# Patient Record
Sex: Male | Born: 1985 | Race: Black or African American | Hispanic: No | Smoking: Never smoker
Health system: Southern US, Community
[De-identification: ages and names within clinical notes are randomized; demographics above are authoritative.]

## PROBLEM LIST (undated history)

## (undated) HISTORY — PX: ROTATOR CUFF REPAIR: SHX139

---

## 2020-06-24 ENCOUNTER — Inpatient Hospital Stay
Admit: 2020-06-24 | Discharge: 2020-06-25 | Disposition: A | Discharge status: Home / Self Care | Attending: Emergency Medicine

## 2020-06-24 DIAGNOSIS — A64 Unspecified sexually transmitted disease: Secondary | ICD-10-CM

## 2020-06-24 NOTE — ED Provider Notes (Signed)
ED Provider Notes by Tressie Ellis, MD at 06/24/20 2009                Author: Tressie Ellis, MD  Service: --  Author Type: Physician       Filed: 06/24/20 2312  Date of Service: 06/24/20 2009  Status: Signed          Editor: Tressie Ellis, MD (Physician)               EMERGENCY DEPARTMENT HISTORY AND PHYSICAL EXAM           Date: 06/24/2020   Patient Name: Matthew Wilkinson        History of Presenting Illness          Chief Complaint       Patient presents with        ?  Penile Discharge           HPI: Matthew Wilkinson, 34 y.o.  male with no past medical history presenting with mild dysuria and penile discharge for the past week.  Patient denies any fevers or chills.   No low pain, nausea, or vomiting.  No diarrhea.  Has been practicing safe sex with one sexual partner however reports that he wants to be tested and treated prophylactically for STDs.         PCP: None        Current Outpatient Medications          Medication  Sig  Dispense  Refill           ?  trimethoprim-polymyxin b (POLYTRIM) ophthalmic solution  Administer 1 Drop to both eyes every four (4) hours.  10 mL  0             Medical History     I reviewed the medical, surgical, family, and social history, as well as allergies:      Past Medical History:   No past medical history on file.      Past Surgical History:   No past surgical history on file.      Family History:   No family history on file.      Social History:     Social History          Tobacco Use         ?  Smoking status:  Not on file     ?  Smokeless tobacco:  Not on file       Substance Use Topics         ?  Alcohol use:  Not on file         ?  Drug use:  Not on file           Allergies:   No Known Allergies        Review of Systems        Review of Systems    All other systems reviewed and are negative.              Physical Exam and Vital Signs     Vital Signs - Reviewed the patient's vital signs.      Patient Vitals for the past 12 hrs:            Temp  Pulse  Resp  BP  SpO2             06/24/20 1721  98.2 ??F (36.8 ??C)  100  16  132/70  100 %  Physical Exam:      GENERAL: awake, alert, cooperative, not in distress   HEENT:   * Pupils equal, EOMI   * Head atraumatic   CV:   * regular rhythm   * warm and perfused extremities bilaterally   PULMONARY: Good air movement   ABDOMEN: soft, not distended, no guarding, not tenderness to palpation   GU: No suprapubic tenderness.  No expressible penile discharge.  No tenderness over the testicles.  No lesions.   EXTREMITIES/BACK: warm and perfused, no tenderness, no edema   SKIN: no rashes or signs of trauma   NEURO:   * Speech clear   * Moves U&LE to command           Medical Decision Making and ED Course     - I am the first and primary provider for this patient and am the primary provider of record.   - I reviewed the vital signs, available nursing notes, past medical history, past surgical history, family history and social history.   - Initial assessment performed. The patients presenting problems have been discussed, and the staff are in agreement with the care plan formulated and outlined with them.  I have encouraged them to ask questions as they arise throughout their visit.   - Available medical records, nursing notes, old EKGs, and EMS run sheets (if patient was EMS transported) were reviewed      MDM:    Patient is a 34 y.o. male  presenting for penile discharge. Vitals reveal no abnormalities and physical exam reveals normalities. Based on the history, physical exam, risk factors, and vitals signs, differential includes: Gonorrhea, chlamydia, trichomonas, UTI.           Results        Labs:     Recent Results (from the past 12 hour(s))     URINALYSIS W/MICROSCOPIC          Collection Time: 06/24/20  5:31 PM         Result  Value  Ref Range            Color  Yellow/Straw          Appearance  Clear  Clear         Specific gravity  >1.030 (H)  1.003 - 1.030       pH (UA)  5.0  5.0 - 8.0         Protein  Negative  Negative mg/dL        Glucose  Negative  Negative mg/dL       Ketone  5 (A)  Negative mg/dL       Bilirubin  Negative  Negative         Blood  Negative  Negative         Urobilinogen  2.0 (H)  0.1 - 1.0 EU/dL       Nitrites  Negative  Negative         Leukocyte Esterase  Trace (A)  Negative         WBC  5-10  0 - 4 /hpf       RBC  0-5  0 - 5 /hpf       Bacteria  Negative  Negative /hpf            Mucus  2+  /lpf           Radiologic Studies:     CT Results   (Last 48 hours)  None                 CXR Results   (Last 48 hours)          None                  Medications ordered:     Medications       cefTRIAXone (ROCEPHIN) 500 mg in lidocaine (XYLOCAINE) 10 mg/mL (1 %) IM syringe (500 mg IntraMUSCular Given 06/24/20 2005)     metroNIDAZOLE (FLAGYL) tablet 2,000 mg (2,000 mg Oral Given 06/24/20 2003)       azithromycin (ZITHROMAX) tablet 1,000 mg (1,000 mg Oral Given 06/24/20 2004)              ED Course        ED Course:         ED Course as of 06/24/20 2312       Mon Jun 24, 2020        1915  UA shows mild sterile pyuria. Concern for STD. Less likely UTI. Will defer antibiotics for UTI till cultures.  [SS]     1953  Patient opts for prophylactic STD treatment. Will give treatments and follow up with return precautions. Patient also has viral conjunctivitis. Will  give polytrim to use in 2 days if sxs dor resolve or get worse. [SS]              ED Course User Index   [SS] Tressie Ellis, MD             Final Disposition        Disposition: Condition stable   DC- Adult Discharges: All of the diagnostic tests were reviewed and questions answered. Diagnosis, care plan and treatment options were discussed.  The patient understands the instructions and will follow up as directed. The patients results have been  reviewed with them.  They have been counseled regarding their diagnosis.  The patient verbally convey understanding and agreement of the signs, symptoms, diagnosis, treatment and prognosis and additionally agrees to follow up as  recommended with their  PCP in 24 - 48 hours.  They also agree with the care-plan and convey that all of their questions have been answered.  I have also put together some discharge instructions for them that include: 1) educational information regarding their diagnosis, 2)  how to care for their diagnosis at home, as well a 3) list of reasons why they would want to return to the ED prior to their follow-up appointment, should their condition change.      DISCHARGE PLAN:   1. Cannot display discharge medications since this patient is not currently admitted.      2.      Follow-up Information               Follow up With  Specialties  Details  Why  Contact Info              [facility]  Emergency Medicine  Go to   If symptoms worsen  [address] [facility] [place] [address]   [phone]              Your doctor    Schedule an appointment as soon as possible for a visit in 2 days                 3.  Return to ED if worse    4.  Discharge Medication List as of 06/24/2020  7:58 PM              START taking these medications          Details        trimethoprim-polymyxin b (POLYTRIM) ophthalmic solution  Administer 1 Drop to both eyes every four (4) hours., Normal, Disp-10 mL, R-0                                 Diagnosis        Clinical Impression:       1.  STD (male)            Attestations:      Tressie Ellis, MD      Please note that this dictation was completed with Dragon, the computer voice recognition software.  Quite often unanticipated grammatical, syntax, homophones, and other interpretive errors are inadvertently  transcribed by the computer software.  Please disregard these errors.  Please excuse any errors that have escaped final proofreading.  Thank you.

## 2020-06-24 NOTE — ED Notes (Signed)
Std eval

## 2020-06-25 LAB — URINALYSIS W/MICROSCOPIC
Bacteria: NEGATIVE /hpf
Bilirubin: NEGATIVE
Blood: NEGATIVE
Glucose: NEGATIVE mg/dL
Ketone: 5 mg/dL — AB
Nitrites: NEGATIVE
Protein: NEGATIVE mg/dL
Specific gravity: >1.03 — ABNORMAL HIGH (ref 1.003–1.030)
Urobilinogen: 2 EU/dL — ABNORMAL HIGH (ref 0.1–1.0)
pH (UA): 5 (ref 5.0–8.0)

## 2020-06-25 LAB — URINALYSIS WITH MICROSCOPIC
BACTERIA, URINE: NEGATIVE /hpf
Bilirubin, Urine: NEGATIVE
Blood, Urine: NEGATIVE
Glucose, Ur: NEGATIVE mg/dL
Ketones, Urine: 5 mg/dL — AB
Nitrite, Urine: NEGATIVE
Protein, UA: NEGATIVE mg/dL
Specific Gravity, UA: >1.03 NA — ABNORMAL HIGH (ref 1.003–1.030)
Urobilinogen, UA, POCT: 2 EU/dL — ABNORMAL HIGH (ref 0.1–1.0)
pH, UA: 5 (ref 5.0–8.0)

## 2020-06-25 MED ORDER — LIDOCAINE HCL 1 % (10 MG/ML) IJ SOLN
10 mg/mL (1 %) | Freq: Once | INTRAMUSCULAR | Status: AC
Start: 2020-06-25 — End: 2020-06-24
  Administered 2020-06-25: 01:00:00 via INTRAMUSCULAR

## 2020-06-25 MED ORDER — AZITHROMYCIN 500 MG TAB
500 mg | ORAL | Status: AC
Start: 2020-06-25 — End: 2020-06-24
  Administered 2020-06-25: 01:00:00 via ORAL

## 2020-06-25 MED ORDER — TRIMETHOPRIM-POLYMYXIN B 0.1 %-10,000 UNIT/ML EYE DROPS
10000 unit- 1 mg/mL | OPHTHALMIC | 0 refills | Status: AC
Start: 2020-06-25 — End: ?

## 2020-06-25 MED ORDER — METRONIDAZOLE 500 MG TAB
500 mg | ORAL | Status: AC
Start: 2020-06-25 — End: 2020-06-24
  Administered 2020-06-25: 01:00:00 via ORAL

## 2020-06-25 MED FILL — CEFTRIAXONE 500 MG SOLUTION FOR INJECTION: 500 mg | INTRAMUSCULAR | Qty: 500

## 2020-06-25 MED FILL — AZITHROMYCIN 500 MG TAB: 500 mg | ORAL | Qty: 2

## 2020-06-25 MED FILL — METRONIDAZOLE 500 MG TAB: 500 mg | ORAL | Qty: 4

## 2020-06-28 LAB — CHLAMYDIA / GC-AMPLIFIED
CHLAMYDIA TRACHOMATIS, NAA, 188078: NEGATIVE
Chlamydia trachomatis, NAA: NEGATIVE
NEISSERIA GONORRHOEAE, NAA, 188086: NEGATIVE
Neisseria gonorrhoeae, NAA: NEGATIVE

## 2021-07-13 ENCOUNTER — Encounter (HOSPITAL_BASED_OUTPATIENT_CLINIC_OR_DEPARTMENT_OTHER): Payer: Self-pay

## 2021-07-13 ENCOUNTER — Emergency Department (HOSPITAL_BASED_OUTPATIENT_CLINIC_OR_DEPARTMENT_OTHER)
Admission: EM | Admit: 2021-07-13 | Discharge: 2021-07-13 | Disposition: A | Discharge status: Home / Self Care | Payer: Self-pay | Attending: Emergency Medicine | Admitting: Emergency Medicine

## 2021-07-13 ENCOUNTER — Other Ambulatory Visit: Payer: Self-pay

## 2021-07-13 ENCOUNTER — Emergency Department (HOSPITAL_BASED_OUTPATIENT_CLINIC_OR_DEPARTMENT_OTHER): Payer: Self-pay | Admitting: Radiology

## 2021-07-13 DIAGNOSIS — S42142A Displaced fracture of glenoid cavity of scapula, left shoulder, initial encounter for closed fracture: Secondary | ICD-10-CM

## 2021-07-13 DIAGNOSIS — S42145A Nondisplaced fracture of glenoid cavity of scapula, left shoulder, initial encounter for closed fracture: Secondary | ICD-10-CM | POA: Insufficient documentation

## 2021-07-13 DIAGNOSIS — X58XXXA Exposure to other specified factors, initial encounter: Secondary | ICD-10-CM | POA: Insufficient documentation

## 2021-07-13 NOTE — ED Provider Notes (Signed)
[facility] EMERGENCY DEPT Provider Note   CSN: [id number] Arrival date & time: 07/13/21  1417     History  Chief Complaint  Patient presents with   Shoulder Injury    Thomas House is a 36 y.o. male.  Patient presents to the emergency department for evaluation of left shoulder pain.  He was holding his girlfriend yesterday and pretended to drop her.  To prevent her from falling, he caught her with his arms and had onset of left shoulder pain.  He did not know if he might of dislocated his shoulder when this occurred.  He has had pain with movement of the left arm, especially raising it above shoulder level.  No distal numbness or tingling.  OTC meds prior to arrival.  Onset of symptoms acute.  Course is constant.  Pain is worse with movement.      Home Medications Prior to Admission medications   Not on File      Allergies    Patient has no known allergies.    Review of Systems   Review of Systems  Constitutional:  Negative for activity change.  Musculoskeletal:  Positive for arthralgias. Negative for back pain, gait problem, joint swelling and neck pain.  Skin:  Negative for wound.  Neurological:  Negative for weakness and numbness.   Physical Exam Updated Vital Signs BP 128/78    Pulse 84    Temp 98.3 F (36.8 C)    Resp 16    Ht 5\' 8"  (1.727 m)    Wt 102.1 kg    SpO2 96%    BMI 34.21 kg/m  Physical Exam Vitals and nursing note reviewed.  Constitutional:      Appearance: He is well-developed.  HENT:     Head: Normocephalic and atraumatic.  Eyes:     Conjunctiva/sclera: Conjunctivae normal.  Cardiovascular:     Pulses: Normal pulses. No decreased pulses.  Musculoskeletal:        General: Tenderness present.     Left shoulder: Tenderness and bony tenderness present. Decreased range of motion.     Left upper arm: No tenderness or bony tenderness.     Left elbow: Normal range of motion. No tenderness.     Cervical back: Normal range of motion and  neck supple. No tenderness. Normal range of motion.     Right lower leg: No edema.     Left lower leg: No edema.     Comments: Anterior tenderness without deformity.  Distal CMS intact.  2+ radial pulse.  Skin:    General: Skin is warm and dry.  Neurological:     Mental Status: He is alert.     Sensory: No sensory deficit.     Comments: Motor, sensation, and vascular distal to the injury is fully intact.   Psychiatric:        Mood and Affect: Mood normal.    ED Results / Procedures / Treatments   Labs (all labs ordered are listed, but only abnormal results are displayed) Labs Reviewed - No data to display  EKG None  Radiology DG Shoulder Left  Result Date: 07/13/2021 CLINICAL DATA:  Left shoulder pain after picking up girlfriend last night. EXAM: LEFT SHOULDER - 2+ VIEW COMPARISON:  None FINDINGS: Fracture noted along the glenoid. Shoulder is located. Humerus is within normal limits. Degenerative changes are noted at the [facility] joint. The hemithorax is clear. IMPRESSION: 1. Nondisplaced glenoid fracture. Consider CT of the left shoulder for further evaluation. 2. No acute  abnormality of the left humerus. Electronically Signed   By: San Morelle M.D.   On: 07/13/2021 15:45    Procedures Procedures    Medications Ordered in ED Medications - No data to display  ED Course/ Medical Decision Making/ A&P    Patient seen and examined. Plan discussed with patient.   Labs: None  Imaging: Reviewed with patient at bedside.  Demonstrates nondisplaced glenoid fracture.  Medications/Fluids: None  Vital signs reviewed and are as follows: BP 128/78    Pulse 84    Temp 98.3 F (36.8 C)    Resp 16    Ht 5\' 8"  (1.727 m)    Wt 102.1 kg    SpO2 96%    BMI 34.21 kg/m   Initial impression: Glenoid fracture, upper extremity neurovascularly intact  7:01 PM discussed recommendation for CT with patient at bedside.  Discussed benefits and limitations of obtaining the CT here.  Patient would  like to defer additional imaging until he can follow-up with orthopedic.  Referral given.  Will place in shoulder immobilizer.  Discussed use of RICE protocol, NSAIDs and Tylenol for pain.                          Medical Decision Making  Patient with shoulder injury, no dislocation but does show glenoid fracture as above.  Upper extremity is neurovascularly intact.  Orthopedic follow-up will be needed.  No signs of cervical injury or other upper extremity injury.          Final Clinical Impression(s) / ED Diagnoses Final diagnoses:  Closed fracture of glenoid cavity and neck of left scapula, initial encounter    Rx / DC Orders ED Discharge Orders     None         Carlisle Cater, PA-C 07/13/21 1903    Fredia Sorrow, MD 07/24/21 2336

## 2021-07-13 NOTE — Discharge Instructions (Signed)
Please read and follow all provided instructions.  Your diagnoses today include:  1. Closed fracture of glenoid cavity and neck of left scapula, initial encounter     Tests performed today include: An x-ray of the affected area -shows a glenoid fracture as we discussed Vital signs. See below for your results today.   Medications prescribed:  Please use over-the-counter NSAID medications (ibuprofen, naproxen) as directed on the packaging for pain if you do not have any reasons not to take these medications just as weak kidneys or a history of bleeding in your stomach or gut.   Take any prescribed medications only as directed.  Home care instructions:  Follow any educational materials contained in this packet Use the shoulder immobilizer until cleared by the orthopedic doctor Follow R.I.C.E. Protocol: R - rest your injury  I  - use ice on injury without applying directly to skin C - compress injury with bandage or splint E - elevate the injury as much as possible  Follow-up instructions: Please call Dr. Serena Croissant office for an appointment.   Return instructions:  Please return if your fingers are numb or tingling, appear gray or blue, or you have severe pain (also elevate the arm and loosen splint or wrap if you were given one) Please return to the Emergency Department if you experience worsening symptoms.  Please return if you have any other emergent concerns.  Additional Information:  Your vital signs today were: BP 128/78    Pulse 84    Temp 98.3 F (36.8 C)    Resp 16    Ht 5\' 8"  (1.727 m)    Wt 102.1 kg    SpO2 96%    BMI 34.21 kg/m  If your blood pressure (BP) was elevated above 135/85 this visit, please have this repeated by your doctor within one month. --------------

## 2021-07-13 NOTE — ED Triage Notes (Signed)
Pt c/o shoulder pain after he was "playing around" yesterday. Distal CMS intact.

## 2021-07-13 NOTE — ED Notes (Signed)
RN provided AVS using Teachback Method. Patient verbalizes understanding of Discharge Instructions. Opportunity for Questioning and Answers were provided by RN. Patient Discharged from ED.  ° °

## 2022-12-18 IMAGING — DX DG SHOULDER 2+V*L*
3 series · 3 of 3 positions shown · non-contrast
Comparison: None

CLINICAL DATA: Left shoulder pain after picking up girlfriend last
night.

EXAM:
LEFT SHOULDER - 2+ VIEW

[shoulder grashey]
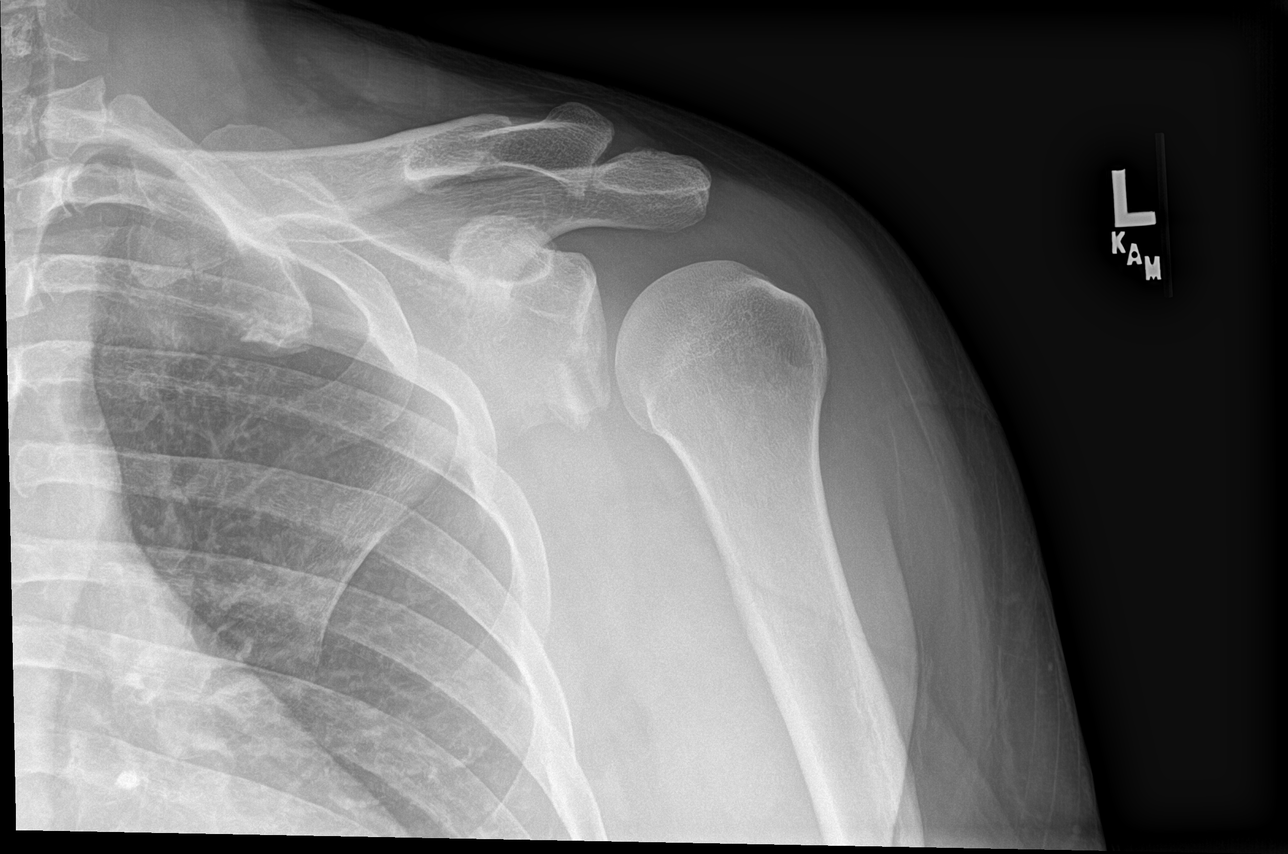

[shoulder y view]
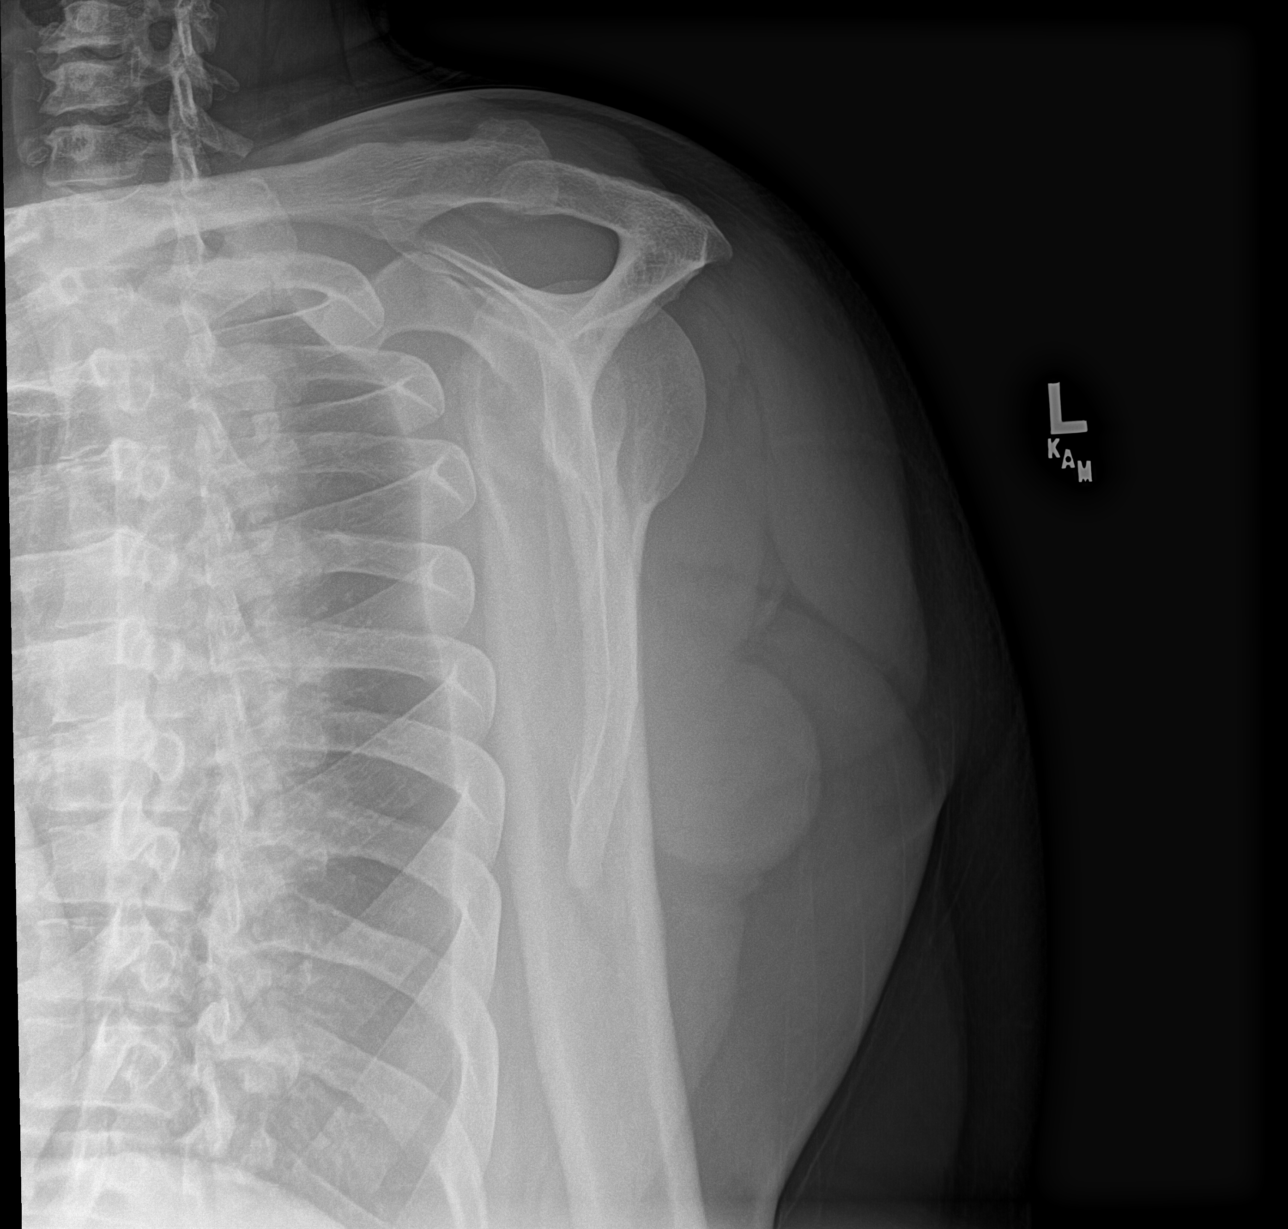

[shoulder axillary]
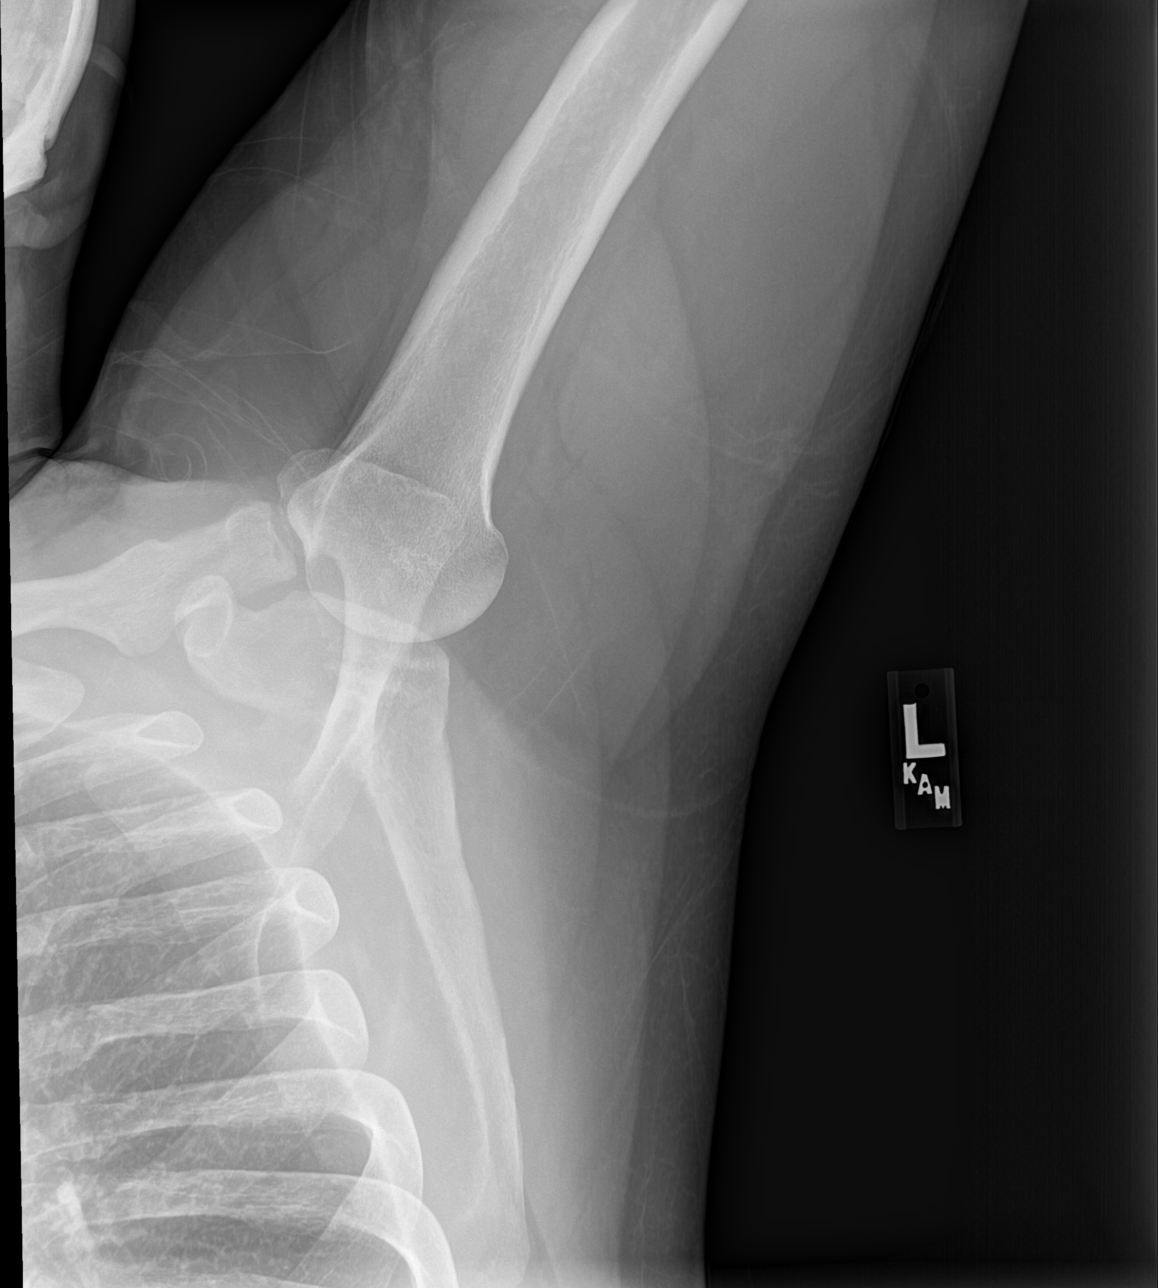

[3 of 3 positions shown; findings below may reference images not displayed]

FINDINGS: Fracture noted along the glenoid. Shoulder is located. Humerus is
within normal limits. Degenerative changes are noted at the AC
joint. The hemithorax is clear.
IMPRESSION: 1. Nondisplaced glenoid fracture. Consider CT of the left shoulder
for further evaluation.
2. No acute abnormality of the left humerus.
